# Patient Record
Sex: Male | Born: 1995 | Race: White | Hispanic: No | Marital: Married | Smoking: Never smoker
Health system: Southern US, Community
[De-identification: ages and names within clinical notes are randomized; demographics above are authoritative.]

## PROBLEM LIST (undated history)

## (undated) DIAGNOSIS — F419 Anxiety disorder, unspecified: Secondary | ICD-10-CM

## (undated) DIAGNOSIS — IMO0002 Reserved for concepts with insufficient information to code with codable children: Secondary | ICD-10-CM

## (undated) DIAGNOSIS — K219 Gastro-esophageal reflux disease without esophagitis: Secondary | ICD-10-CM

## (undated) DIAGNOSIS — F32A Depression, unspecified: Secondary | ICD-10-CM

## (undated) DIAGNOSIS — T7840XA Allergy, unspecified, initial encounter: Secondary | ICD-10-CM

## (undated) HISTORY — DX: Anxiety disorder, unspecified: F41.9

## (undated) HISTORY — DX: Reserved for concepts with insufficient information to code with codable children: IMO0002

## (undated) HISTORY — DX: Gastro-esophageal reflux disease without esophagitis: K21.9

## (undated) HISTORY — DX: Allergy, unspecified, initial encounter: T78.40XA

## (undated) HISTORY — PX: TONSILLECTOMY: SUR1361

## (undated) HISTORY — DX: Depression, unspecified: F32.A

---
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
## 2017-04-12 ENCOUNTER — Emergency Department
Admission: EM | Admit: 2017-04-12 | Discharge: 2017-04-12 | Disposition: A | Discharge status: Home / Self Care | Payer: BLUE CROSS/BLUE SHIELD | Attending: Student in an Organized Health Care Education/Training Program | Admitting: Student in an Organized Health Care Education/Training Program

## 2017-04-12 ENCOUNTER — Emergency Department: Payer: BLUE CROSS/BLUE SHIELD

## 2017-04-12 DIAGNOSIS — R1013 Epigastric pain: Secondary | ICD-10-CM | POA: Diagnosis not present

## 2017-04-12 DIAGNOSIS — R42 Dizziness and giddiness: Secondary | ICD-10-CM | POA: Insufficient documentation

## 2017-04-12 DIAGNOSIS — R112 Nausea with vomiting, unspecified: Secondary | ICD-10-CM | POA: Diagnosis not present

## 2017-04-12 DIAGNOSIS — F419 Anxiety disorder, unspecified: Secondary | ICD-10-CM | POA: Diagnosis not present

## 2017-04-12 LAB — CBC WITH DIFFERENTIAL/PLATELET
BASOS ABS: 0 10*3/uL (ref 0–0.1)
BASOS PCT: 1 %
EOS PCT: 2 %
Eosinophils Absolute: 0.1 10*3/uL (ref 0–0.7)
HEMATOCRIT: 46.6 % (ref 40.0–52.0)
Hemoglobin: 16.3 g/dL (ref 13.0–18.0)
Lymphocytes Relative: 35 %
Lymphs Abs: 1.6 10*3/uL (ref 1.0–3.6)
MCH: 31 pg (ref 26.0–34.0)
MCHC: 35 g/dL (ref 32.0–36.0)
MCV: 88.7 fL (ref 80.0–100.0)
MONO ABS: 0.4 10*3/uL (ref 0.2–1.0)
Monocytes Relative: 8 %
NEUTROS ABS: 2.6 10*3/uL (ref 1.4–6.5)
Neutrophils Relative %: 54 %
Platelets: 181 10*3/uL (ref 150–440)
RBC: 5.25 MIL/uL (ref 4.40–5.90)
RDW: 11.9 % (ref 11.5–14.5)
WBC: 4.7 10*3/uL (ref 3.8–10.6)

## 2017-04-12 LAB — URINALYSIS, COMPLETE (UACMP) WITH MICROSCOPIC
BACTERIA UA: NONE SEEN
Bilirubin Urine: NEGATIVE
Glucose, UA: NEGATIVE mg/dL
Hgb urine dipstick: NEGATIVE
Ketones, ur: NEGATIVE mg/dL
NITRITE: NEGATIVE
PH: 8 (ref 5.0–8.0)
Protein, ur: 30 mg/dL — AB
RBC / HPF: NONE SEEN RBC/hpf (ref 0–5)
SPECIFIC GRAVITY, URINE: 1.017 (ref 1.005–1.030)
Squamous Epithelial / LPF: NONE SEEN

## 2017-04-12 LAB — COMPREHENSIVE METABOLIC PANEL
ALBUMIN: 5.1 g/dL — AB (ref 3.5–5.0)
ALK PHOS: 53 U/L (ref 38–126)
ALT: 12 U/L — AB (ref 17–63)
AST: 23 U/L (ref 15–41)
Anion gap: 11 (ref 5–15)
BILIRUBIN TOTAL: 0.9 mg/dL (ref 0.3–1.2)
BUN: 8 mg/dL (ref 6–20)
CO2: 28 mmol/L (ref 22–32)
CREATININE: 1.07 mg/dL (ref 0.61–1.24)
Calcium: 9.8 mg/dL (ref 8.9–10.3)
Chloride: 104 mmol/L (ref 101–111)
GFR calc Af Amer: >60 mL/min (ref >60)
GLUCOSE: 110 mg/dL — AB (ref 65–99)
POTASSIUM: 3.4 mmol/L — AB (ref 3.5–5.1)
Sodium: 143 mmol/L (ref 135–145)
TOTAL PROTEIN: 7.8 g/dL (ref 6.5–8.1)

## 2017-04-12 LAB — URINE DRUG SCREEN, QUALITATIVE (ARMC ONLY)
Amphetamines, Ur Screen: NOT DETECTED
BARBITURATES, UR SCREEN: NOT DETECTED
Benzodiazepine, Ur Scrn: NOT DETECTED
CANNABINOID 50 NG, UR ~~LOC~~: NOT DETECTED
Cocaine Metabolite,Ur ~~LOC~~: NOT DETECTED
MDMA (ECSTASY) UR SCREEN: NOT DETECTED
Methadone Scn, Ur: NOT DETECTED
Opiate, Ur Screen: NOT DETECTED
PHENCYCLIDINE (PCP) UR S: NOT DETECTED
TRICYCLIC, UR SCREEN: NOT DETECTED

## 2017-04-12 MED ORDER — PROMETHAZINE HCL 25 MG/ML IJ SOLN
12.5000 mg | Freq: Four times a day (QID) | INTRAMUSCULAR | Status: DC | PRN
  Administered 2017-04-12: 12.5 mg via INTRAVENOUS
  Filled 2017-04-12: qty 1

## 2017-04-12 MED ORDER — PROMETHAZINE HCL 25 MG/ML IJ SOLN: 12.5000 mg | Freq: Four times a day (QID) | INTRAMUSCULAR | Status: DC | PRN

## 2017-04-12 MED ORDER — LORAZEPAM 1 MG PO TABS
1.0000 mg | ORAL_TABLET | Freq: Once | ORAL | Status: AC
  Administered 2017-04-12: 1 mg via ORAL
  Filled 2017-04-12: qty 1

## 2017-04-12 MED ORDER — SODIUM CHLORIDE 0.9 % IV BOLUS (SEPSIS)
1000.0000 mL | Freq: Once | INTRAVENOUS | Status: AC
  Administered 2017-04-12: 1000 mL via INTRAVENOUS

## 2017-04-12 NOTE — ED Triage Notes (Signed)
Patient presents with vomiting and shaking. States it has been going on since Sunday without any relief. Thought he just had "bad anxiety" but continues to dry heave in triage. Patient's mother denies fever at home. Patient is able to keep bland foods down but not much.

## 2017-04-12 NOTE — ED Notes (Signed)
Pt request going home and states he does not want to speak with a psychiatrist tonight and will just follow up instead.

## 2017-04-12 NOTE — ED Provider Notes (Addendum)
[facility] Emergency Department Provider Note    None    (approximate)  I have reviewed the triage vital signs and the nursing notes.   HISTORY  Chief Complaint Emesis    HPI Seth Barton is a 21 y.o. male presents with chief complaint of recurrent vomiting and nausea since Sunday. States that he does have a history of bad anxiety and has been dry heaving today. No palpable retching. States she feels lightheaded and shaky.  Describes the pain as mild to moderate burning sensation in his epigastric area. No right upper quadrant pain. No right lower quadrant pain.  No recent fevers. No shortness of breath. States he did start feeling very depressed yesterday but denies any SI or HI.  Reports father had similar symptoms at the age of 77 recently starting college similar to his son today.   History reviewed. No pertinent past medical history. No family history on file. Past Surgical History:  Procedure Laterality Date  . TONSILLECTOMY     There are no active problems to display for this patient.     Prior to Admission medications   Not on File    Allergies Calamine and Neosporin [neomycin-bacitracin zn-polymyx]    Social History Social History  Substance Use Topics  . Smoking status: Not on file  . Smokeless tobacco: Not on file  . Alcohol use Not on file    Review of Systems Patient denies headaches, rhinorrhea, blurry vision, numbness, shortness of breath, chest pain, edema, cough, abdominal pain, nausea, vomiting, diarrhea, dysuria, fevers, rashes or hallucinations unless otherwise stated above in HPI. ____________________________________________   PHYSICAL EXAM:  VITAL SIGNS: Vitals:   04/12/17 1946 04/12/17 2001  BP: (!) 156/83   Pulse: (!) 142   Resp: (!) 22   Temp:  98.7 F (37.1 C)  SpO2: 100%     Constitutional: Alert and oriented. anxious appearing and in no acute distress. Eyes: Conjunctivae are normal.  Head:  Atraumatic. Nose: No congestion/rhinnorhea. Mouth/Throat: Mucous membranes are moist.   Neck: No stridor. Painless ROM.  Cardiovascular: Normal rate, regular rhythm. Grossly normal heart sounds.  Good peripheral circulation. Respiratory: Normal respiratory effort.  No retractions. Lungs CTAB. Gastrointestinal: Soft and nontender. No distention. No abdominal bruits. No CVA tenderness. Musculoskeletal: No lower extremity tenderness nor edema.  No joint effusions. Neurologic:  Normal speech and language. No gross focal neurologic deficits are appreciated. No facial droop Skin:  Skin is warm, dry and intact. No rash noted. Psychiatric: anxious appearing, no SI no HI  ____________________________________________   LABS (all labs ordered are listed, but only abnormal results are displayed)  Results for orders placed or performed during the hospital encounter of 04/12/17 (from the past 24 hour(s))  CBC with Differential/Platelet     Status: None   Collection Time: 04/12/17  8:01 PM  Result Value Ref Range   WBC 4.7 3.8 - 10.6 K/uL   RBC 5.25 4.40 - 5.90 MIL/uL   Hemoglobin 16.3 13.0 - 18.0 g/dL   HCT 09.8 11.9 - 14.7 %   MCV 88.7 80.0 - 100.0 fL   MCH 31.0 26.0 - 34.0 pg   MCHC 35.0 32.0 - 36.0 g/dL   RDW 82.9 56.2 - 13.0 %   Platelets 181 150 - 440 K/uL   Neutrophils Relative % 54 %   Neutro Abs 2.6 1.4 - 6.5 K/uL   Lymphocytes Relative 35 %   Lymphs Abs 1.6 1.0 - 3.6 K/uL   Monocytes Relative 8 %  Monocytes Absolute 0.4 0.2 - 1.0 K/uL   Eosinophils Relative 2 %   Eosinophils Absolute 0.1 0 - 0.7 K/uL   Basophils Relative 1 %   Basophils Absolute 0.0 0 - 0.1 K/uL  Comprehensive metabolic panel     Status: Abnormal   Collection Time: 04/12/17  8:01 PM  Result Value Ref Range   Sodium 143 135 - 145 mmol/L   Potassium 3.4 (L) 3.5 - 5.1 mmol/L   Chloride 104 101 - 111 mmol/L   CO2 28 22 - 32 mmol/L   Glucose, Bld 110 (H) 65 - 99 mg/dL   BUN 8 6 - 20 mg/dL   Creatinine, Ser  1.61 0.61 - 1.24 mg/dL   Calcium 9.8 8.9 - 09.6 mg/dL   Total Protein 7.8 6.5 - 8.1 g/dL   Albumin 5.1 (H) 3.5 - 5.0 g/dL   AST 23 15 - 41 U/L   ALT 12 (L) 17 - 63 U/L   Alkaline Phosphatase 53 38 - 126 U/L   Total Bilirubin 0.9 0.3 - 1.2 mg/dL   GFR calc non Af Amer >60 >60 mL/min   GFR calc Af Amer >60 >60 mL/min   Anion gap 11 5 - 15  Urine Drug Screen, Qualitative ([facility] only)     Status: None   Collection Time: 04/12/17  8:40 PM  Result Value Ref Range   Tricyclic, Ur Screen NONE DETECTED NONE DETECTED   Amphetamines, Ur Screen NONE DETECTED NONE DETECTED   MDMA (Ecstasy)Ur Screen NONE DETECTED NONE DETECTED   Cocaine Metabolite,Ur [place] NONE DETECTED NONE DETECTED   Opiate, Ur Screen NONE DETECTED NONE DETECTED   Phencyclidine (PCP) Ur S NONE DETECTED NONE DETECTED   Cannabinoid 50 Ng, Ur [place] NONE DETECTED NONE DETECTED   Barbiturates, Ur Screen NONE DETECTED NONE DETECTED   Benzodiazepine, Ur Scrn NONE DETECTED NONE DETECTED   Methadone Scn, Ur NONE DETECTED NONE DETECTED  Urinalysis, Complete w Microscopic     Status: Abnormal   Collection Time: 04/12/17  8:40 PM  Result Value Ref Range   Color, Urine YELLOW (A) YELLOW   APPearance CLEAR (A) CLEAR   Specific Gravity, Urine 1.017 1.005 - 1.030   pH 8.0 5.0 - 8.0   Glucose, UA NEGATIVE NEGATIVE mg/dL   Hgb urine dipstick NEGATIVE NEGATIVE   Bilirubin Urine NEGATIVE NEGATIVE   Ketones, ur NEGATIVE NEGATIVE mg/dL   Protein, ur 30 (A) NEGATIVE mg/dL   Nitrite NEGATIVE NEGATIVE   Leukocytes, UA TRACE (A) NEGATIVE   RBC / HPF NONE SEEN 0 - 5 RBC/hpf   WBC, UA 6-30 0 - 5 WBC/hpf   Bacteria, UA NONE SEEN NONE SEEN   Squamous Epithelial / LPF NONE SEEN NONE SEEN   ____________________________________________  EKG My review and personal interpretation at Time: 20:06   Indication: tachycardia  Rate: 110  Rhythm: sinus Axis: nomal Other: normal intervals, no wpw or  brugada ____________________________________________  RADIOLOGY  I personally reviewed all radiographic images ordered to evaluate for the above acute complaints and reviewed radiology reports and findings.  These findings were personally discussed with the patient.  Please see medical record for radiology report.  ____________________________________________   PROCEDURES  Procedure(s) performed:  Procedures    Critical Care performed: no ____________________________________________   INITIAL IMPRESSION / ASSESSMENT AND PLAN / ED COURSE  Pertinent labs & imaging results that were available during my care of the patient were reviewed by me and considered in my medical decision making (see chart for details).  DDX: enteritis, gastritirs, boerhaaves, anxiety, dysrhythmia, dehydration  Seth Barton is a 21 y.o. who presents to the ED with vomiting and shaking spells and what appears consistent with an anxiety attack. EKG is reassuring. Patient is tachycardic and slightly hyperventilating but with good air movement. Symptoms improved with IV fluids as well as Phenergan and Ativan. His abdominal exam is soft and benign. Is not clinically consistent with appendicitis or cholelithiasis or cholecystitis. Blood work is otherwise reassuring. Is no evidence of pneumomediastinum. He has no crepitus on exam. Patient denies any SI or HI. He does have follow-up with his pediatrician in the morning. As he is tolerating oral hydration and vital signs are stable with improvement and says symptoms after Phenergan and Ativan and he believed that he stable and appropriate for follow-up    ----------------------------------------- 9:35 PM on 04/12/2017 -----------------------------------------  Back to speak with the patient and family and they're requesting to speak to the psychiatrist. Based on his intractable and what appears to be pretty severe anxiety without establish care do believe that's a  reasonable option. I will consult telepsych for their medication recommendations.   ____________________________________________   FINAL CLINICAL IMPRESSION(S) / ED DIAGNOSES  Final diagnoses:  Non-intractable vomiting with nausea, unspecified vomiting type  Anxiety      NEW MEDICATIONS STARTED DURING THIS VISIT:  New Prescriptions   No medications on file     Note:  This document was prepared using Dragon voice recognition software and may include unintentional dictation errors.    Willy Eddy, MD 04/12/17 2130    Willy Eddy, MD 04/12/17 2132    Willy Eddy, MD 04/12/17 2135

## 2017-04-12 NOTE — ED Notes (Signed)
[facility] Telepsych has been cancelled per nurse Clinton Sawyer.

## 2017-04-12 NOTE — ED Notes (Signed)
Telepsych has been called per Dr. Danella Penton order.

## 2017-04-12 NOTE — ED Notes (Signed)
ED Provider at bedside. 

## 2023-12-10 ENCOUNTER — Emergency Department
Admission: EM | Admit: 2023-12-10 | Discharge: 2023-12-10 | Disposition: A | Discharge status: Home / Self Care | Attending: Emergency Medicine | Admitting: Emergency Medicine

## 2023-12-10 ENCOUNTER — Other Ambulatory Visit: Payer: Self-pay

## 2023-12-10 ENCOUNTER — Emergency Department

## 2023-12-10 DIAGNOSIS — Y92410 Unspecified street and highway as the place of occurrence of the external cause: Secondary | ICD-10-CM | POA: Insufficient documentation

## 2023-12-10 DIAGNOSIS — M25551 Pain in right hip: Secondary | ICD-10-CM | POA: Diagnosis present

## 2023-12-10 DIAGNOSIS — M7918 Myalgia, other site: Secondary | ICD-10-CM

## 2023-12-10 MED ORDER — NAPROXEN 500 MG PO TABS: 500.0000 mg | ORAL_TABLET | Freq: Two times a day (BID) | ORAL | 0 refills | Status: DC
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
## 2023-12-10 NOTE — Discharge Instructions (Signed)
 Follow-up with your primary care provider or urgent care if any continued problems or not improving in 4 to 5 days.  A prescription for naproxen was sent to the pharmacy for you to take twice a day for the next 7 to 10 days.  Make sure that you eat with this medication as it could cause stomach upset.  Moist heat or ice to your muscles as needed for discomfort.  Passive stretching may also give some improvement.

## 2024-07-18 ENCOUNTER — Ambulatory Visit: Payer: Self-pay | Admitting: General Practice

## 2024-07-18 ENCOUNTER — Ambulatory Visit: Admitting: General Practice

## 2024-07-18 ENCOUNTER — Encounter: Payer: Self-pay | Admitting: General Practice

## 2024-07-18 VITALS — BP 118/82 | HR 107 | Temp 98.6°F | Ht 68.9 in | Wt 168.8 lb

## 2024-07-18 DIAGNOSIS — F32A Depression, unspecified: Secondary | ICD-10-CM

## 2024-07-18 DIAGNOSIS — K581 Irritable bowel syndrome with constipation: Secondary | ICD-10-CM | POA: Diagnosis not present

## 2024-07-18 DIAGNOSIS — R1011 Right upper quadrant pain: Secondary | ICD-10-CM

## 2024-07-18 DIAGNOSIS — Z7689 Persons encountering health services in other specified circumstances: Secondary | ICD-10-CM | POA: Insufficient documentation

## 2024-07-18 DIAGNOSIS — F419 Anxiety disorder, unspecified: Secondary | ICD-10-CM

## 2024-07-18 LAB — COMPREHENSIVE METABOLIC PANEL WITH GFR
ALT: 9 U/L (ref 3–53)
AST: 12 U/L (ref 5–37)
Albumin: 5 g/dL (ref 3.5–5.2)
Alkaline Phosphatase: 64 U/L (ref 39–117)
BUN: 10 mg/dL (ref 6–23)
CO2: 28 meq/L (ref 19–32)
Calcium: 9.8 mg/dL (ref 8.4–10.5)
Chloride: 101 meq/L (ref 96–112)
Creatinine, Ser: 0.77 mg/dL (ref 0.40–1.50)
GFR: 123.09 mL/min
Glucose, Bld: 82 mg/dL (ref 70–99)
Potassium: 4.5 meq/L (ref 3.5–5.1)
Sodium: 137 meq/L (ref 135–145)
Total Bilirubin: 0.8 mg/dL (ref 0.2–1.2)
Total Protein: 7.2 g/dL (ref 6.0–8.3)

## 2024-07-18 LAB — CBC WITH DIFFERENTIAL/PLATELET
Basophils Absolute: 0.1 K/uL (ref 0.0–0.1)
Basophils Relative: 1.5 % (ref 0.0–3.0)
Eosinophils Absolute: 0.2 K/uL (ref 0.0–0.7)
Eosinophils Relative: 4.2 % (ref 0.0–5.0)
HCT: 45.2 % (ref 39.0–52.0)
Hemoglobin: 15.6 g/dL (ref 13.0–17.0)
Lymphocytes Relative: 22.8 % (ref 12.0–46.0)
Lymphs Abs: 1.1 K/uL (ref 0.7–4.0)
MCHC: 34.5 g/dL (ref 30.0–36.0)
MCV: 86.6 fl (ref 78.0–100.0)
Monocytes Absolute: 0.4 K/uL (ref 0.1–1.0)
Monocytes Relative: 7.4 % (ref 3.0–12.0)
Neutro Abs: 3.2 K/uL (ref 1.4–7.7)
Neutrophils Relative %: 64.1 % (ref 43.0–77.0)
Platelets: 206 K/uL (ref 150.0–400.0)
RBC: 5.22 Mil/uL (ref 4.22–5.81)
RDW: 12.5 % (ref 11.5–15.5)
WBC: 5 K/uL (ref 4.0–10.5)

## 2024-07-18 LAB — LIPASE: Lipase: 10 U/L — ABNORMAL LOW (ref 11.0–59.0)

## 2024-07-18 LAB — TSH: TSH: 0.94 u[IU]/mL (ref 0.35–5.50)

## 2024-07-18 MED ORDER — HYDROXYZINE HCL 10 MG PO TABS: 10.0000 mg | ORAL_TABLET | Freq: Three times a day (TID) | ORAL | 0 refills | Status: AC | PRN

## 2024-07-18 NOTE — Patient Instructions (Addendum)
 Stop by the lab prior to leaving today. I will notify you of your results once received.   Avoid spicy, greasy, fried foods.   Align/or any other probiotic once daily.   Please start Colace 100 mg 2 capsules in the morning and 2 at bedtime.  Miralax once daily.  Ensure that you are drinking 64 ounces of water daily.  Increase fiber intake.   Someone will call you to schedule the ultrasound.   Start hydroxyzine  10 mg as needed for anxiety.  F/u in two weeks.   It was a pleasure to meet you today! Please don't hesitate to contact me with any questions. Welcome to [facility]!

## 2024-07-18 NOTE — Assessment & Plan Note (Addendum)
 EMR reviewed briefly.

## 2024-07-18 NOTE — Progress Notes (Signed)
 "  New Patient Office Visit  Subjective    Patient ID: Seth Barton, male    DOB: [birth year]  Age: 29 y.o. MRN: [id number]  CC:  Chief Complaint  Patient presents with   New Patient (Initial Visit)    Establish care   Abdominal Pain    R upper & mid abd pain that radiates in his back; some episodes last 2 hrs; severe episodes last about 10 mins. Does have dull discomfort between attacks; worsens after greasy or fried foods & worse after large meals; worse w/ movement and lifting; feels like food does not move & sits in his stomach for hrs. Does have a lot constipation and pale/chalky colored stools at times. Does get pains sometimes in the mornings even w/o eating when he wakes up. More recently having excess sweating and diarrhea.     HPI Seth Barton is a 29 y.o. male presents to establish care. Previous pcp/physical/labs: [facility]. No recent physical and labs.   Discussed the use of AI scribe software for clinical note transcription with the patient, who gave verbal consent to proceed.  History of Present Illness Seth Barton is a 29 year old male with IBS who presents with abdominal pain and changes in bowel habits.  He experiences abdominal pain primarily in the right upper and mid-abdominal area, present for approximately four years. Initially, it was a 'little twinge' once every six months, but it has significantly worsened over the past two months. The pain is dull, lasting up to two hours, with discomfort persisting between episodes. It is exacerbated by greasy and fried foods and wraps around to the spine, stopping on the right side.  He notes a change in bowel habits, with recent constipation being unusual as he typically experiences diarrhea. He describes his stools as 'very white, almost chalky like,' occurring two to three times recently.  He has a history of anxiety and depression, previously managed with citalopram (Celexa) 10-20 mg, which he  discontinued five to six years ago after feeling stable. Recent months have been particularly stressful, potentially exacerbating his symptoms. He experiences anxiety related to medical procedures, particularly blood draws, due to a past negative experience resulting in vasovagal syncope.  His family history includes gallbladder problems in his father at a similar age, though surgery was not required. There is no family history of colon, stomach, or rectal cancer. His paternal grandfather had type 2 diabetes.  He has allergies to milk and cheese, which he actively avoids. He is asymptomatic to corn, despite it being listed as an allergy. Beef is tolerable if prepared medium well. 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     Outpatient Encounter Medications as of 07/18/2024  Medication Sig   hydrOXYzine  (ATARAX ) 10 MG tablet Take 1 tablet (10 mg total) by mouth 3 (three) times daily as needed for anxiety.   loratadine (CLARITIN) 10 MG tablet Take 10 mg by mouth daily as needed for allergies.   [DISCONTINUED] glycopyrrolate (ROBINUL) 1 MG tablet Take 1 mg by mouth 2 (two) times daily. (Patient not taking: Reported on 07/18/2024)   [DISCONTINUED] naproxen  (NAPROSYN ) 500 MG tablet Take 1 tablet (500 mg total) by mouth 2 (two) times daily with a meal.   No facility-administered encounter medications on file as of 07/18/2024.    Past Medical History:  Diagnosis Date   Allergy    Anxiety    Depression  GERD (gastroesophageal reflux disease)    Ulcer     Past Surgical History:  Procedure Laterality Date   TONSILLECTOMY      Family History  Problem Relation Age of Onset   Depression Mother    Cancer Mother    Cancer Maternal Grandfather    Diabetes Paternal Grandfather    Cancer Paternal Grandmother     Social History   Socioeconomic History   Marital status: Married    Spouse name: Darryle   Number of children: Not  on file   Years of education: Not on file   Highest education level: Bachelor's degree (e.g., BA, AB, BS)  Occupational History   Not on file  Tobacco Use   Smoking status: Never   Smokeless tobacco: Never  Vaping Use   Vaping status: Never Used  Substance and Sexual Activity   Alcohol use: Yes    Comment: Very Rarely   Drug use: Not Currently    Types: Marijuana    Comment: I have not partaken in 6-7 years.   Sexual activity: Yes    Birth control/protection: Condom  Other Topics Concern   Not on file  Social History Narrative   Not on file   Social Drivers of Health   Tobacco Use: Low Risk (07/18/2024)   Patient History    Smoking Tobacco Use: Never    Smokeless Tobacco Use: Never    Passive Exposure: Not on file  Financial Resource Strain: Low Risk (07/17/2024)   Overall Financial Resource Strain (CARDIA)    Difficulty of Paying Living Expenses: Not hard at all  Food Insecurity: No Food Insecurity (07/17/2024)   Epic    Worried About Programme Researcher, Broadcasting/film/video in the Last Year: Never true    Ran Out of Food in the Last Year: Never true  Transportation Needs: No Transportation Needs (07/17/2024)   Epic    Lack of Transportation (Medical): No    Lack of Transportation (Non-Medical): No  Physical Activity: Insufficiently Active (07/17/2024)   Exercise Vital Sign    Days of Exercise per Week: 1 day    Minutes of Exercise per Session: 30 min  Stress: Stress Concern Present (07/17/2024)   [employer] of Occupational Health - Occupational Stress Questionnaire    Feeling of Stress: Very much  Social Connections: Moderately Integrated (07/17/2024)   Social Connection and Isolation Panel    Frequency of Communication with Friends and Family: Once a week    Frequency of Social Gatherings with Friends and Family: Once a week    Attends Religious Services: 1 to 4 times per year    Active Member of [employer] or Organizations: Yes    Attends Engineer, Structural: More than 4  times per year    Marital Status: Married  Catering Manager Violence: Not on file  Depression (PHQ2-9): High Risk (07/18/2024)   Depression (PHQ2-9)    PHQ-2 Score: 14  Alcohol Screen: Low Risk (07/17/2024)   Alcohol Screen    Last Alcohol Screening Score (AUDIT): 2  Housing: Unknown (07/17/2024)   Epic    Unable to Pay for Housing in the Last Year: No    Number of Times Moved in the Last Year: Not on file    Homeless in the Last Year: No  Utilities: Not on file  Health Literacy: Not on file    Review of Systems  Constitutional:  Negative for chills and fever.  Respiratory:  Negative for shortness of breath.   Cardiovascular:  Negative for  chest pain.  Gastrointestinal:  Negative for abdominal pain, constipation, diarrhea, heartburn, nausea and vomiting.  Genitourinary:  Negative for dysuria, frequency and urgency.  Neurological:  Negative for dizziness and headaches.  Endo/Heme/Allergies:  Negative for polydipsia.  Psychiatric/Behavioral:  Negative for depression and suicidal ideas. The patient is not nervous/anxious.         Objective    BP 118/82   Pulse (!) 107   Temp 98.6 F (37 C) (Oral)   Ht 5' 8.9 (1.75 m)   Wt 168 lb 12.8 oz (76.6 kg)   SpO2 98%   BMI 25.00 kg/m   Physical Exam Vitals and nursing note reviewed.  Constitutional:      Appearance: Normal appearance.  Cardiovascular:     Rate and Rhythm: Normal rate and regular rhythm.     Pulses: Normal pulses.     Heart sounds: Normal heart sounds.  Pulmonary:     Effort: Pulmonary effort is normal.     Breath sounds: Normal breath sounds.  Abdominal:     General: Abdomen is flat. Bowel sounds are normal.     Tenderness: There is generalized abdominal tenderness and tenderness in the right upper quadrant and epigastric area.     Hernia: No hernia is present.  Neurological:     Mental Status: He is alert and oriented to person, place, and time.  Psychiatric:        Mood and Affect: Mood normal.         Behavior: Behavior normal.        Thought Content: Thought content normal.        Judgment: Judgment normal.         Assessment & Plan:  Irritable bowel syndrome with constipation  Establishing care with new doctor, encounter for Assessment & Plan: EMR reviewed briefly.     Anxiety and depression -     hydrOXYzine  HCl; Take 1 tablet (10 mg total) by mouth 3 (three) times daily as needed for anxiety.  Dispense: 30 tablet; Refill: 0  RUQ pain -     US  ABDOMEN LIMITED RUQ (LIVER/GB); Future -     CBC with Differential/Platelet -     Comprehensive metabolic panel with GFR -     Lipase -     TSH    Assessment and Plan Assessment & Plan Irritable bowel syndrome with constipation Chronic constipation with recent exacerbation. Symptoms worsen with greasy and fried foods. Differential includes gallbladder disease due to right upper quadrant pain and stool changes. - Referred to gastroenterologist for further evaluation and potential colonoscopy. - Start probiotic (Align, Florastor, or Culturelle) once daily. - Increase fluid intake. - Use Colace (two capsules in the morning and two at night) for constipation. - Use Miralax as needed for stool softening. - Avoid fried, greasy foods, alcohol, and spicy foods. - Follow up in two weeks.   Right upper quadrant pain, possible gallbladder disease Intermittent right upper quadrant pain, worsening over two months. Pain exacerbated by greasy foods and associated with pale, chalky stools. Differential includes gallbladder disease, possibly cholelithiasis or cholecystitis. Family history of gallbladder issues in father. - Ordered right upper quadrant ultrasound to evaluate gallbladder. - Ordered blood work to check for infection, liver function, and pancreas function. - Advised ER visit if pain becomes severe or is accompanied by nausea, vomiting, or diarrhea. - Provided note to avoid lifting and moving until further notice.  Anxiety and  depression Severe anxiety and depression, exacerbated by recent stressors. Previously managed with  citalopram, discontinued 5-6 years ago. Discussed potential stress-related exacerbation of gastrointestinal symptoms. - Prescribed hydroxyzine  for situational anxiety. - Discussed potential for therapy referral.  - Advised to discuss potential restart of citalopram if anxiety and depression worsen.   Return in about 2 weeks (around 08/01/2024) for abdominal pain.SABRA Carrol Aurora, NP  "

## 2024-07-25 ENCOUNTER — Ambulatory Visit
Admission: RE | Admit: 2024-07-25 | Discharge: 2024-07-25 | Disposition: A | Discharge status: Home / Self Care | Source: Ambulatory Visit | Attending: General Practice | Admitting: General Practice

## 2024-07-25 DIAGNOSIS — R1011 Right upper quadrant pain: Secondary | ICD-10-CM | POA: Diagnosis present

## 2024-08-01 ENCOUNTER — Encounter: Payer: Self-pay | Admitting: General Practice

## 2024-08-01 ENCOUNTER — Ambulatory Visit: Admitting: General Practice

## 2024-08-01 ENCOUNTER — Encounter: Payer: Self-pay | Admitting: Physician Assistant

## 2024-08-01 VITALS — BP 120/78 | HR 83 | Temp 98.3°F | Ht 68.9 in | Wt 172.8 lb

## 2024-08-01 DIAGNOSIS — K581 Irritable bowel syndrome with constipation: Secondary | ICD-10-CM | POA: Diagnosis not present

## 2024-08-01 DIAGNOSIS — R1011 Right upper quadrant pain: Secondary | ICD-10-CM | POA: Diagnosis not present

## 2024-08-01 NOTE — Progress Notes (Signed)
 "  Established Patient Office Visit  Subjective   Patient ID: Seth Barton, male    DOB: [birth year]  Age: 29 y.o. MRN: [id number]  Chief Complaint  Patient presents with   Abdominal Pain    GI referral placed last week.    Abdominal Pain Pertinent negatives include no constipation, diarrhea, dysuria, fever, frequency, headaches, nausea or vomiting.    Discussed the use of AI scribe software for clinical note transcription with the patient, who gave verbal consent to proceed.  History of Present Illness Seth Barton is a 29 year old male who presents with right upper quadrant discomfort.  He experiences discomfort rather than pain in the right upper quadrant, radiating towards the back. The discomfort is described as a dull ache about half an inch under the ribs, extending down to about two inches outside of the umbilicus, where he sometimes experiences a sharp, stabbing pain.  He has a history of extreme constipation but notes that the current discomfort does not feel the same as past constipation episodes. He has been using over-the-counter probiotics and has made significant dietary changes, avoiding fried, greasy foods, alcohol, and spicy foods, which he believes have contributed to some improvement in his symptoms. He reports very rare alcohol consumption and has been taking Colace and Miralax to manage his bowel movements.  An ultrasound was performed previously, and the patient recalls being told that it showed no free fluid in the abdomen, no ascites, and no gallstones. He has not yet undergone a HIDA scan.  He mentions a family history of gallbladder issues, noting that his father had a severe gallbladder flare-up about ten years ago.    Patient Active Problem List   Diagnosis Date Noted   Establishing care with new doctor, encounter for 07/18/2024   Irritable bowel syndrome with constipation 07/18/2024   Anxiety and depression 07/18/2024   RUQ pain 07/18/2024    Past Medical History:  Diagnosis Date   Allergy    Anxiety    Depression    GERD (gastroesophageal reflux disease)    Ulcer    Past Surgical History:  Procedure Laterality Date   TONSILLECTOMY     Allergies[1]       08/01/2024   12:42 PM 07/18/2024   12:04 PM  Depression screen PHQ 2/9  Decreased Interest 2 1  Down, Depressed, Hopeless 1 2  PHQ - 2 Score 3 3  Altered sleeping 3 3  Tired, decreased energy 2 2  Change in appetite 3 3  Feeling bad or failure about yourself  1 0  Trouble concentrating 1 3  Moving slowly or fidgety/restless 1 0  Suicidal thoughts 0 0  PHQ-9 Score 14 14  Difficult doing work/chores Somewhat difficult Somewhat difficult       08/01/2024   12:42 PM  GAD 7 : Generalized Anxiety Score  Nervous, Anxious, on Edge 3  Control/stop worrying 2  Worry too much - different things 2  Trouble relaxing 2  Restless 0  Easily annoyed or irritable 1  Afraid - awful might happen 3  Total GAD 7 Score 13  Anxiety Difficulty Somewhat difficult      Review of Systems  Constitutional:  Negative for chills and fever.  Respiratory:  Negative for shortness of breath.   Cardiovascular:  Negative for chest pain.  Gastrointestinal:  Positive for abdominal pain. Negative for constipation, diarrhea, heartburn, nausea and vomiting.  Genitourinary:  Negative for dysuria, frequency and urgency.  Neurological:  Negative for dizziness  and headaches.  Endo/Heme/Allergies:  Negative for polydipsia.  Psychiatric/Behavioral:  Negative for depression and suicidal ideas. The patient is not nervous/anxious.       Objective:     BP 120/78   Pulse 83   Temp 98.3 F (36.8 C) (Temporal)   Ht 5' 8.9 (1.75 m)   Wt 172 lb 12.8 oz (78.4 kg)   SpO2 99%   BMI 25.59 kg/m  BP Readings from Last 3 Encounters:  08/01/24 120/78  07/18/24 118/82  12/10/23 (!) 153/100   Wt Readings from Last 3 Encounters:  08/01/24 172 lb 12.8 oz (78.4 kg)  07/18/24 168 lb 12.8 oz  (76.6 kg)  04/12/17 145 lb (65.8 kg) (33%, Z= -0.43)*   * Growth percentiles are based on CDC (Boys, 2-20 Years) data.      Physical Exam Vitals and nursing note reviewed.  Constitutional:      Appearance: Normal appearance.  Cardiovascular:     Rate and Rhythm: Normal rate and regular rhythm.     Pulses: Normal pulses.     Heart sounds: Normal heart sounds.  Pulmonary:     Effort: Pulmonary effort is normal.     Breath sounds: Normal breath sounds.  Neurological:     Mental Status: He is alert and oriented to person, place, and time.  Psychiatric:        Mood and Affect: Mood normal.        Behavior: Behavior normal.        Thought Content: Thought content normal.        Judgment: Judgment normal.      No results found for any visits on 08/01/24.     The ASCVD Risk score (Arnett DK, et al., 2019) failed to calculate for the following reasons:   The 2019 ASCVD risk score is only valid for ages 31 to 36   * - Cholesterol units were assumed    Assessment & Plan:  There are no diagnoses linked to this encounter.  Assessment and Plan Assessment & Plan Right upper quadrant pain, possible gallbladder disease Intermittent pain possibly due to gallbladder dysfunction. Ultrasound negative for gallstones. Lipase low, not pancreatitis. Pain improved with diet and probiotics. Family history of gallbladder issues. Differential includes gallbladder dysfunction versus other GI causes. - Continue dietary modifications and probiotics. - Referred to Dr. Abran for further evaluation. - Consider scheduling HIDA scan before specialist visit if feasible.  Irritable bowel syndrome with constipation Managed with diet and probiotics. No current extreme constipation symptoms. Previous episodes noted. - Continue dietary modifications and probiotics. - Continue Colace and Miralax as needed for constipation management.   No follow-ups on file.    Carrol Aurora, NP    [1]   Allergies Allergen Reactions   Benzalkonium Chloride Dermatitis and Hives   Other Other (See Comments)    Stomach pain and cramping, all dairy   Shellfish Allergy Other (See Comments)    Unknown, tested positive and family history   Soy Allergy (Obsolete) Other (See Comments)    Stomach pain and cramping, all soy products   Bacitracin-Polymyxin B Hives   Beef Allergy Other (See Comments)    Is able to eat small amount of beef, but not large amounts at one time. If he consumes large amount he has itching.   Calamine Rash   Corn-Containing Products Other (See Comments)    Positive blood test   Neosporin [Neomycin-Bacitracin Zn-Polymyx] Rash   Pork Allergy Other (See Comments)    Is  able to eat small amount of beef, but not large amounts at one time. If he consumes large amount he has itching.   Wheat Other (See Comments)    Positive blood test   "

## 2024-08-01 NOTE — Patient Instructions (Signed)
 Call and schedule appt with Dr. Abran.  Call and schedule appt for HIDA scan as discussed.    Follow up with me after you have seen Dr. Abran.   It was a pleasure to see you today!

## 2024-08-06 ENCOUNTER — Ambulatory Visit: Admitting: General Practice

## 2024-08-27 ENCOUNTER — Ambulatory Visit: Admitting: Physician Assistant
